# Patient Record
Sex: Female | Born: 1995 | Hispanic: Yes | Marital: Single | Smoking: Never smoker
Health system: Southern US, Community
[De-identification: ages and names within clinical notes are randomized; demographics above are authoritative.]

## PROBLEM LIST (undated history)

## (undated) DIAGNOSIS — F32A Depression, unspecified: Secondary | ICD-10-CM

## (undated) DIAGNOSIS — F329 Major depressive disorder, single episode, unspecified: Secondary | ICD-10-CM

---
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
## 2014-07-22 NOTE — H&P (Signed)
PATIENT NAME:  Sherry Rose, CHIASSON MR#:  [id number] DATE OF BIRTH:  [birth year]  DATE OF ADMISSION:  01/25/2012  IDENTIFYING INFORMATION AND CHIEF COMPLAINT: 19 year old woman who was brought to the Emergency Room after taking an intentional overdose of over-the-counter pain medicine.   CHIEF COMPLAINT: "I took an overdose".   HISTORY OF PRESENT ILLNESS: The patient reports that yesterday she was feeling down, worried, feeling bad about herself so she took an overdose of about 9 pills which she thinks were ibuprofen. Judging from the labs it looks like possibly they were actually acetaminophen. She says at the time she actually was thinking about killing herself although after doing it she started to feel bad and told a friend. She has been feeling down and depressed probably for most of a year. Mood stays down. She has a lot of negative thoughts about herself. Feels fatigued a lot of the time. Her sleep is poor. She denies any psychotic symptoms. Denies that she routinely thinks about killing herself but frequently feels hopeless. She has stress from worrying about her poor relationship with her father and also her work and her future. Also says that she worries a lot that people spread rumors about her and it hurts her feelings and makes her feel bad. She denies using drugs or alcohol. She is not currently getting any psychiatric or psychological treatment.   PAST PSYCHIATRIC HISTORY: She says that for most of the past school year she was seen by the counselor at her school regularly. The school was worried enough about her that they sent her to the [facility] to be evaluated but, according to the patient, she was told there that she had "no problem" and she was not given any further treatment. Never been in a psychiatric hospital before. Denies that she has ever tried to kill herself in the past.   SUBSTANCE ABUSE HISTORY: Denies any history of alcohol or drug abuse.   FAMILY HISTORY:  Denies any family history of mental illness.   SOCIAL HISTORY: The patient graduated from high school and is currently working doing some Location manager. She is hoping to save up enough money to someday go to college. She lives with her parents and three younger brothers. She does not get along with her father who sounds like he is overprotective of her and treats her as though she had something to be ashamed of. She feels frequently shamed by people who have spread rumors about her. She does have friends who she trusts and feels good about and she has a good relationship with her brothers.   PAST MEDICAL HISTORY: Denies any significant medical problems.   CURRENT MEDICATIONS: None.   ALLERGIES: No known drug allergies.   REVIEW OF SYSTEMS: Complains of depression, fatigue, low self esteem, negative thoughts about herself. Denies any psychotic symptoms. Denies acute suicidal ideation.   MENTAL STATUS EXAM: Neatly dressed and groomed woman who looks her stated age. Cooperative with the interview. Eye contact was pretty good. Psychomotor activity depressed. Affect flat, dysphoric, and depressed. Speech quiet and slow. Affect down. Mood stated as being still down. Thoughts are lucid but slow. No loosening of associations. No delusional thinking evident. Denies hallucinations. Denies acute suicidal or homicidal ideation. Intelligence normal. Short and long-term memory appear grossly intact. Alert and oriented x4.   PHYSICAL EXAMINATION:   GENERAL: The patient is a healthy appearing young woman who does not appear to be in any acute distress.   SKIN: No skin  lesions evident or complained of.   HEENT: Pupils are equal and reactive. Face is symmetric. Teeth are normal. Oral mucosa normal. Musculature normal tone. Strength and reflexes normal throughout.   NEUROLOGICAL: Cranial nerves symmetric and normal. Neck and back nontender.   HEART: Regular rate and rhythm. No extra sounds.    LUNGS: Clear. No wheezes.   ABDOMEN: Nontender. Normal bowel sounds.   EXTREMITIES: Gait normal. Full range of motion at all extremities.   VITAL SIGNS: Temperature 97.7, pulse 69, respirations 18, blood pressure 126/64.   LABORATORY DATA: Drug screen negative. Pregnancy screen negative. TSH normal. Chemistries unremarkable. She did have a slightly elevated acetaminophen level at 70 but it is not in the toxic range.   ASSESSMENT: This is an 19 year old woman who took an overdose probably of Tylenol. May have had real suicidal intent at the time. Has multiple other symptoms and history consistent with depression. Appropriate admission because of depression, suicidal ideation, failure of any outpatient treatment.   TREATMENT PLAN:  1. Supportive therapy and counseling.  2. Engage in groups and activities.  3. Daily monitoring of mood and behavior.  4. Review of labs and vitals.  5. I suggested to her that we start citalopram 20 mg a day for depression and she agreed.   DIAGNOSIS PRINCIPLE AND PRIMARY:  AXIS I: Major depressive episode, single, severe.   SECONDARY DIAGNOSES:  AXIS I: No further.   AXIS II: No diagnosis.   AXIS III: Status post acetaminophen overdose without likely sequela.   AXIS IV: Moderate. Chronic stress from family dysfunction.   AXIS V: Functioning at time of evaluation 30.    ____________________________ Audery AmelJohn T. Gurtha Picker, MD jtc:drc D: 01/25/2012 18:51:57 ET T: 01/26/2012 06:56:42 ET JOB#: [id number]  cc: Audery AmelJohn T. Embry Manrique, MD, <Dictator> Audery AmelJOHN T Dacota Devall MD ELECTRONICALLY SIGNED 01/26/2012 22:47

## 2014-07-22 NOTE — Discharge Summary (Signed)
PATIENT NAME:  Sherry Rose, Sherry Rose MR#:  [id number] DATE OF BIRTH:  05-12-1995  DATE OF ADMISSION:  01/25/2012 DATE OF DISCHARGE:  01/27/2012  HOSPITAL COURSE: See dictated History and Physical for details of admission. This 19 year old woman came into the hospital after taking an overdose of over-the-counter pain medicine. She had had some suicidal ideation. She gave a history of what sounds like a major depression that has been going on for months. She was cooperative with treatment and forthcoming about her symptoms. She has been treated with citalopram 20 mg a day and has tolerated it fine without any side effects. She has participated appropriately in groups and individual therapy. She has not made any suicide attempts or stated any suicidal ideation on the unit. She states that she regrets what she did and she has positive things in her life to look forward to, particularly looking forward to getting back home with her family and following her long-term goals of making money and going to school. She has been counseled and educated about depression and the importance of staying involved in regular treatment as well as avoiding substance abuse. She understands and is agreeable to the treatment plan. At this point, she will be discharged home with [facility].  She is currently on citalopram 20 mg per day as medication.   LABORATORY RESULTS: Admission labs showed an acetaminophen level of 70, suggesting that was what she overdosed on even though she said ibuprofen. Creatinine level was low at 0.54, CO2 elevated at 26. Alcohol undetectable. CBC normal. TSH normal. Drug screen negative. Urinalysis normal. Pregnancy test negative.   DISCHARGE MEDICATIONS: Citalopram 20 mg p.o. daily.   MENTAL STATUS EXAM:  Neatly dressed and groomed young woman who looks her stated age, cooperative and pleasant in the interview. Good eye contact, normal psychomotor activity. Voice  normal rate, tone, and volume. Affect slightly flat but reactive. Mood stated as better. Thoughts are lucid with no loosening of associations or delusional thinking. Denies auditory or visual hallucinations. Denies any suicidal or homicidal ideation. Shows improved insight and judgment and normal intelligence, good short and long-term memory.   DISPOSITION: Discharge home with her family. [facility] and possibly medication management.   DIAGNOSIS PRINCIPLE AND PRIMARY:  AXIS I: Major depressive episode, single, severe.   SECONDARY DIAGNOSES:  AXIS I: No further.  AXIS II: No diagnosis.   AXIS III: No diagnosis.   AXIS IV: Moderate to severe social stress from her family and peer group.   AXIS V: Functioning at time of discharge: 65.      ____________________________ Audery AmelJohn T. Mellody Masri, MD jtc:bjt D: 01/27/2012 13:49:04 ET T: 01/28/2012 15:03:51 ET JOB#: [id number]  cc: Audery AmelJohn T. Moo Gravley, MD, <Dictator> Audery AmelJOHN T Tresa Jolley MD ELECTRONICALLY SIGNED 01/29/2012 22:44

## 2018-01-15 ENCOUNTER — Other Ambulatory Visit (HOSPITAL_COMMUNITY)
Admission: RE | Admit: 2018-01-15 | Discharge: 2018-01-15 | Disposition: A | Discharge status: Home / Self Care | Payer: BLUE CROSS/BLUE SHIELD | Source: Ambulatory Visit | Attending: Obstetrics & Gynecology | Admitting: Obstetrics & Gynecology

## 2018-01-15 ENCOUNTER — Ambulatory Visit: Payer: BLUE CROSS/BLUE SHIELD | Admitting: Obstetrics & Gynecology

## 2018-01-15 ENCOUNTER — Encounter: Payer: Self-pay | Admitting: Obstetrics & Gynecology

## 2018-01-15 VITALS — BP 90/60 | Ht 62.0 in | Wt 137.0 lb

## 2018-01-15 DIAGNOSIS — Z124 Encounter for screening for malignant neoplasm of cervix: Secondary | ICD-10-CM

## 2018-01-15 DIAGNOSIS — N926 Irregular menstruation, unspecified: Secondary | ICD-10-CM | POA: Diagnosis not present

## 2018-01-15 DIAGNOSIS — N930 Postcoital and contact bleeding: Secondary | ICD-10-CM | POA: Diagnosis not present

## 2018-01-15 NOTE — Progress Notes (Addendum)
Consulted by: Dr Myrene Galas Consulted for: Abnormal GYN bleeding  HPI:      Sherry Rose is a 22 y.o. G0P0000 who LMP was No LMP recorded., presents today for a problem visit.  She complains of metrorrhagia (no pattern) and post-coital bleeding that  began 8-10 months ago and its severity is described as moderate.  She has IUD for 3 years and at forst had rare bleeding but now bleeds unpredictably but every few weeks BUT ALSO AFTER SEX, with clots and pain (moderate, midline, no radiation, no modifiers or other assoc sx's)..  She has used the following for attempts at control: maxi pad.  Previous evaluation: none. Prior Diagnosis: dysfunctional uterine bleeding. Previous Treatment: none.  She is single partner, contraception - IUD.  Hx of STDs: none. She is premenopausal.  PMHx: She  has no past medical history on file. Also,  has no past surgical history on file., family history is not on file.,  reports that she has never smoked. She has never used smokeless tobacco. She reports that she does not drink alcohol or use drugs.  She  Current Outpatient Medications:  .  levonorgestrel (MIRENA) 20 MCG/24HR IUD, 1 each by Intrauterine route once., Disp: , Rfl:  .  sertraline (ZOLOFT) 50 MG tablet, TAKE 1/2 TABLET ONCE DAILY FOR 3 DAYS THEN 1 TABLET DAILY, Disp: , Rfl: 4  Also, has No Known Allergies.  Review of Systems  Constitutional: Negative for chills, fever and malaise/fatigue.  HENT: Negative for congestion, sinus pain and sore throat.   Eyes: Negative for blurred vision and pain.  Respiratory: Negative for cough and wheezing.   Cardiovascular: Negative for chest pain and leg swelling.  Gastrointestinal: Negative for abdominal pain, constipation, diarrhea, heartburn, nausea and vomiting.  Genitourinary: Negative for dysuria, frequency, hematuria and urgency.  Musculoskeletal: Negative for back pain, joint pain, myalgias and neck pain.  Skin: Negative for itching  and rash.  Neurological: Negative for dizziness, tremors and weakness.  Endo/Heme/Allergies: Does not bruise/bleed easily.  Psychiatric/Behavioral: Negative for depression. The patient is not nervous/anxious and does not have insomnia.     Objective: BP 90/60   Ht 5\' 2"  (1.575 m)   Wt 137 lb (62.1 kg)   BMI 25.06 kg/m  Physical Exam  Constitutional: She is oriented to person, place, and time. She appears well-developed and well-nourished. No distress.  Genitourinary: Rectum normal, vagina normal and uterus normal. Pelvic exam was performed with patient supine. There is no rash or lesion on the right labia. There is no rash or lesion on the left labia. Vagina exhibits no lesion. No bleeding in the vagina. Right adnexum does not display mass and does not display tenderness. Left adnexum does not display mass and does not display tenderness. Cervix does not exhibit motion tenderness, lesion, friability or polyp.   Uterus is mobile and midaxial. Uterus is not enlarged or exhibiting a mass.  Genitourinary Comments: No IUD strings seen No cervical lesions or polyps Uterus deviated to left  HENT:  Head: Normocephalic and atraumatic. Head is without laceration.  Right Ear: Hearing normal.  Left Ear: Hearing normal.  Nose: No epistaxis.  No foreign bodies.  Mouth/Throat: Uvula is midline, oropharynx is clear and moist and mucous membranes are normal.  Eyes: Pupils are equal, round, and reactive to light.  Neck: Normal range of motion. Neck supple. No thyromegaly present.  Cardiovascular: Normal rate and regular rhythm. Exam reveals no gallop and no friction rub.  No murmur heard. Pulmonary/Chest:  Effort normal and breath sounds normal. No respiratory distress. She has no wheezes.  Abdominal: Soft. Bowel sounds are normal. She exhibits no distension. There is no tenderness. There is no rebound.  Musculoskeletal: Normal range of motion.  Neurological: She is alert and oriented to person, place,  and time. No cranial nerve deficit.  Skin: Skin is warm and dry.  Psychiatric: She has a normal mood and affect. Judgment normal.  Vitals reviewed.  ASSESSMENT/PLAN:  abnormal bleeding on hormone replacement therapy (IUD)  Problem List Items Addressed This Visit      Other   PCB (post coital bleeding) - Primary   Relevant Orders   Cytology - PAP   US PELVIS TRANSVANGINAL NON-OB (TV ONLY)   Irregular menses   Relevant Orders   US PELVIS TRANSVANGINAL NON-OB (TV ONLY)    Other Visit Diagnoses    Screening for cervical cancer       Relevant Orders   Cytology - PAP    Patient has abnormal uterine bleeding . She has a normal exam today, with no evidence of lesions.  Evaluation includes the following: exam, labs such as hormonal testing, and pelvic ultrasound to evaluate for any structural gynecologic abnormalities.  Patient to follow up after testing.  Treatment option for menorrhagia or menometrorrhagia discussed in great detail with the patient.  Options include hormonal therapy, IUD therapy such as Mirena, D&C, Ablation, and Hysterectomy.  The pros and cons of each option discussed with patient.  Annamarie Major, MD, [facility], [facility] 01/15/2018  9:42 AM

## 2018-01-15 NOTE — Patient Instructions (Signed)

## 2018-01-17 LAB — CYTOLOGY - PAP
Chlamydia: NEGATIVE
Diagnosis: NEGATIVE
Neisseria Gonorrhea: NEGATIVE

## 2018-01-23 ENCOUNTER — Ambulatory Visit (INDEPENDENT_AMBULATORY_CARE_PROVIDER_SITE_OTHER): Payer: BLUE CROSS/BLUE SHIELD | Admitting: Obstetrics & Gynecology

## 2018-01-23 ENCOUNTER — Encounter: Payer: Self-pay | Admitting: Obstetrics & Gynecology

## 2018-01-23 ENCOUNTER — Ambulatory Visit (INDEPENDENT_AMBULATORY_CARE_PROVIDER_SITE_OTHER): Payer: BLUE CROSS/BLUE SHIELD

## 2018-01-23 VITALS — BP 100/60 | Ht 62.0 in | Wt 136.0 lb

## 2018-01-23 DIAGNOSIS — Z30432 Encounter for removal of intrauterine contraceptive device: Secondary | ICD-10-CM

## 2018-01-23 DIAGNOSIS — N926 Irregular menstruation, unspecified: Secondary | ICD-10-CM | POA: Diagnosis not present

## 2018-01-23 DIAGNOSIS — N930 Postcoital and contact bleeding: Secondary | ICD-10-CM

## 2018-01-23 MED ORDER — NORETHIN ACE-ETH ESTRAD-FE 1-20 MG-MCG(24) PO CAPS: 1.0000 | ORAL_CAPSULE | Freq: Every day | ORAL | 1 refills | Status: DC

## 2018-01-23 NOTE — Patient Instructions (Signed)
Ethinyl Estradiol; Norethindrone Acetate; Ferrous fumarate tablets or capsules What is this medicine? ETHINYL ESTRADIOL; NORETHINDRONE ACETATE; FERROUS FUMARATE (ETH in il es tra DYE ole; nor eth IN drone AS e tate; FER us FUE ma rate) is an oral contraceptive. The products combine two types of female hormones, an estrogen and a progestin. They are used to prevent ovulation and pregnancy. Some products are also used to treat acne in females. This medicine may be used for other purposes; ask your health care provider or pharmacist if you have questions. COMMON BRAND NAME(S): Blisovi 24 Fe, Blisovi Fe, Estrostep Fe, Gildess 24 Fe, Gildess Fe 1.5/30, Gildess Fe 1/20, Junel Fe 1.5/30, Junel Fe 1/20, Junel Fe 24, Larin Fe, Lo Loestrin Fe, Loestrin 24 Fe, Loestrin FE 1.5/30, Loestrin FE 1/20, Lomedia 24 Fe, Microgestin 24 Fe, Microgestin Fe 1.5/30, Microgestin Fe 1/20, Tarina Fe 1/20, Taytulla, Tilia Fe, Tri-Legest Fe What should I tell my health care provider before I take this medicine? They need to know if you have any of these conditions: -abnormal vaginal bleeding -blood vessel disease -breast, cervical, endometrial, ovarian, liver, or uterine cancer -diabetes -gallbladder disease -heart disease or recent heart attack -high blood pressure -high cholesterol -history of blood clots -kidney disease -liver disease -migraine headaches -smoke tobacco -stroke -systemic lupus erythematosus (SLE) -an unusual or allergic reaction to estrogens, progestins, other medicines, foods, dyes, or preservatives -pregnant or trying to get pregnant -breast-feeding How should I use this medicine? Take this medicine by mouth. To reduce nausea, this medicine may be taken with food. Follow the directions on the prescription label. Take this medicine at the same time each day and in the order directed on the package. Do not take your medicine more often than directed. A patient package insert for the product will be  given with each prescription and refill. Read this sheet carefully each time. The sheet may change frequently. Contact your pediatrician regarding the use of this medicine in children. Special care may be needed. This medicine has been used in female children who have started having menstrual periods. Overdosage: If you think you have taken too much of this medicine contact a poison control center or emergency room at once. NOTE: This medicine is only for you. Do not share this medicine with others. What if I miss a dose? If you miss a dose, refer to the patient information sheet you received with your medicine for direction. If you miss more than one pill, this medicine may not be as effective and you may need to use another form of birth control. What may interact with this medicine? Do not take this medicine with the following medication: -dasabuvir; ombitasvir; paritaprevir; ritonavir -ombitasvir; paritaprevir; ritonavir This medicine may also interact with the following medications: -acetaminophen -antibiotics or medicines for infections, especially rifampin, rifabutin, rifapentine, and griseofulvin, and possibly penicillins or tetracyclines -aprepitant -ascorbic acid (vitamin C) -atorvastatin -barbiturate medicines, such as phenobarbital -bosentan -carbamazepine -caffeine -clofibrate -cyclosporine -dantrolene -doxercalciferol -felbamate -grapefruit juice -hydrocortisone -medicines for anxiety or sleeping problems, such as diazepam or temazepam -medicines for diabetes, including pioglitazone -mineral oil -modafinil -mycophenolate -nefazodone -oxcarbazepine -phenytoin -prednisolone -ritonavir or other medicines for HIV infection or AIDS -rosuvastatin -selegiline -soy isoflavones supplements -St. John's wort -tamoxifen or raloxifene -theophylline -thyroid hormones -topiramate -warfarin This list may not describe all possible interactions. Give your health care  provider a list of all the medicines, herbs, non-prescription drugs, or dietary supplements you use. Also tell them if you smoke, drink alcohol, or use illegal drugs. Some   items may interact with your medicine. What should I watch for while using this medicine? Visit your doctor or health care professional for regular checks on your progress. You will need a regular breast and pelvic exam and Pap smear while on this medicine. Use an additional method of contraception during the first cycle that you take these tablets. If you have any reason to think you are pregnant, stop taking this medicine right away and contact your doctor or health care professional. If you are taking this medicine for hormone related problems, it may take several cycles of use to see improvement in your condition. Smoking increases the risk of getting a blood clot or having a stroke while you are taking birth control pills, especially if you are more than 24 years old. You are strongly advised not to smoke. This medicine can make your body retain fluid, making your fingers, hands, or ankles swell. Your blood pressure can go up. Contact your doctor or health care professional if you feel you are retaining fluid. This medicine can make you more sensitive to the sun. Keep out of the sun. If you cannot avoid being in the sun, wear protective clothing and use sunscreen. Do not use sun lamps or tanning beds/booths. If you wear contact lenses and notice visual changes, or if the lenses begin to feel uncomfortable, consult your eye care specialist. In some women, tenderness, swelling, or minor bleeding of the gums may occur. Notify your dentist if this happens. Brushing and flossing your teeth regularly may help limit this. See your dentist regularly and inform your dentist of the medicines you are taking. If you are going to have elective surgery, you may need to stop taking this medicine before the surgery. Consult your health care  professional for advice. This medicine does not protect you against HIV infection (AIDS) or any other sexually transmitted diseases. What side effects may I notice from receiving this medicine? Side effects that you should report to your doctor or health care professional as soon as possible: -allergic reactions like skin rash, itching or hives, swelling of the face, lips, or tongue -breast tissue changes or discharge -changes in vaginal bleeding during your period or between your periods -changes in vision -chest pain -confusion -coughing up blood -dizziness -feeling faint or lightheaded -headaches or migraines -leg, arm or groin pain -loss of balance or coordination -severe or sudden headaches -stomach pain (severe) -sudden shortness of breath -sudden numbness or weakness of the face, arm or leg -symptoms of vaginal infection like itching, irritation or unusual discharge -tenderness in the upper abdomen -trouble speaking or understanding -vomiting -yellowing of the eyes or skin Side effects that usually do not require medical attention (report to your doctor or health care professional if they continue or are bothersome): -breakthrough bleeding and spotting that continues beyond the 3 initial cycles of pills -breast tenderness -mood changes, anxiety, depression, frustration, anger, or emotional outbursts -increased sensitivity to sun or ultraviolet light -nausea -skin rash, acne, or brown spots on the skin -weight gain (slight) This list may not describe all possible side effects. Call your doctor for medical advice about side effects. You may report side effects to FDA at 1-800-FDA-1088. Where should I keep my medicine? Keep out of the reach of children. Store at room temperature between 15 and 30 degrees C (59 and 86 degrees F). Throw away any unused medicine after the expiration date. NOTE: This sheet is a summary. It may not cover all possible information. If you   have  questions about this medicine, talk to your doctor, pharmacist, or health care provider.  2018 Elsevier/Gold Standard (2015-11-30 08:04:41)  

## 2018-01-23 NOTE — Progress Notes (Signed)
  HPI: She complains of metrorrhagia (no pattern) and post-coital bleeding that  began 8-10 months ago and its severity is described as moderate.  She has IUD (for period control, not sexually active) for 3 years and at first had rare bleeding but now bleeds unpredictably but every few weeks BUT ALSO AFTER SEX, with clots and pain (moderate, midline, no radiation, no modifiers or other assoc sx's).  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 Also,  has no past surgical history on file., family history is not on file.,  reports that she has never smoked. She has never used smokeless tobacco. She reports that she does not drink alcohol or use drugs.  She has a current medication list which includes the following prescription(s): levonorgestrel and sertraline. Also, has No Known Allergies.  Review of Systems  All other systems reviewed and are negative.  Objective: BP 100/60   Ht 5\' 2"  (1.575 m)   Wt 136 lb (61.7 kg)   BMI 24.87 kg/m   Physical examination Constitutional NAD, Conversant  Skin No rashes, lesions or ulceration.   Extremities: Moves all appropriately.  Normal ROM for age. No lymphadenopathy.  Neuro: Grossly intact  Psych: Oriented to PPT.  Normal mood. Normal affect.   US Pelvis Transvanginal Non-ob (tv Only)  Result Date: 01/23/2018 Patient Name: Sherry Rose DOB: Jul 12, 1995 MRN: [id number] ULTRASOUND REPORT Location: [facility] Date of Service: 01/23/2018 Indications:AUB Findings: The uterus is anteflexed and measures 7.9 x 4.5 x 2.6cm. Echo texture is homogenous without evidence of focal masses. The Endometrium measures 3.1 mm. Ultrasound appearance of arcuate uterus. IUD in place with questionable limbs pushing into myometrium due to arcuate uterus. Right Ovary measures 3.1 x 1.9 x 1.9 cm. It is normal in appearance. Left Ovary measures 2.2 x 1.7 x 1.7 cm.  It is normal in appearance. Survey of the adnexa demonstrates no adnexal masses. There is no free fluid in the cul de sac. Impression: 1. IUD in place with ultrasound appearance of arcuate uterus. Recommendations: 1.Clinical correlation with the patient's History and Physical Exam. Sherry Rose, RDMS RVT Review of ULTRASOUND.    I have personally reviewed images and report of recent ultrasound done at [facility].    Plan of management to be discussed with patient. Sherry Major, MD, FACOG [facility], [facility] 01/23/2018  2:23 PM   Pelvic exam:  Two IUD strings present seen coming from the cervical os. EGBUS, vaginal vault and cervix: within normal limits  IUD Removal Strings of IUD identified and grasped.  IUD removed without problem.  Pt tolerated this well.  IUD noted to be intact.  Assessment:  PCB (post coital bleeding) Irregular menses  Remove IUD today and start OCP    (Not sexually active)    Desires period control that has not been the case w IUD last several mos.    Rx [place]  A total of 15 minutes were spent face-to-face with the patient during this encounter and over half of that time dealt with counseling and coordination of care.  Sherry Major, MD, [facility], [facility] 01/23/2018  2:24 PM

## 2018-04-04 ENCOUNTER — Encounter: Payer: Self-pay | Admitting: Emergency Medicine

## 2018-04-04 ENCOUNTER — Emergency Department: Payer: BLUE CROSS/BLUE SHIELD

## 2018-04-04 ENCOUNTER — Emergency Department
Admission: EM | Admit: 2018-04-04 | Discharge: 2018-04-04 | Disposition: A | Discharge status: Home / Self Care | Payer: BLUE CROSS/BLUE SHIELD | Attending: Emergency Medicine | Admitting: Emergency Medicine

## 2018-04-04 ENCOUNTER — Other Ambulatory Visit: Payer: Self-pay

## 2018-04-04 DIAGNOSIS — H1089 Other conjunctivitis: Secondary | ICD-10-CM | POA: Diagnosis not present

## 2018-04-04 DIAGNOSIS — Z79899 Other long term (current) drug therapy: Secondary | ICD-10-CM | POA: Diagnosis not present

## 2018-04-04 DIAGNOSIS — F329 Major depressive disorder, single episode, unspecified: Secondary | ICD-10-CM | POA: Diagnosis not present

## 2018-04-04 DIAGNOSIS — R05 Cough: Secondary | ICD-10-CM | POA: Diagnosis present

## 2018-04-04 DIAGNOSIS — J40 Bronchitis, not specified as acute or chronic: Secondary | ICD-10-CM | POA: Insufficient documentation

## 2018-04-04 DIAGNOSIS — H1032 Unspecified acute conjunctivitis, left eye: Secondary | ICD-10-CM

## 2018-04-04 HISTORY — DX: Depression, unspecified: F32.A

## 2018-04-04 HISTORY — DX: Major depressive disorder, single episode, unspecified: F32.9

## 2018-04-04 LAB — GROUP A STREP BY PCR: GROUP A STREP BY PCR: NOT DETECTED

## 2018-04-04 MED ORDER — PREDNISONE 50 MG PO TABS: ORAL_TABLET | ORAL | 0 refills | Status: DC

## 2018-04-04 MED ORDER — AZITHROMYCIN 250 MG PO TABS: ORAL_TABLET | ORAL | 0 refills | Status: AC

## 2018-04-04 MED ORDER — POLYMYXIN B-TRIMETHOPRIM 10000-0.1 UNIT/ML-% OP SOLN: 1.0000 [drp] | OPHTHALMIC | 0 refills | Status: AC

## 2018-04-04 NOTE — ED Triage Notes (Addendum)
Pt in via POV, reports cough, congestion x approximately two weeks without any relief.  Vitals WDL, NAD noted at this time.

## 2018-04-04 NOTE — ED Notes (Signed)
Pt states that she has had a cough and swelling in throat for the last week that "has not gotten better." Pt states that in the past she would take tamiflu and then it would get better but this time it has "gotten worse in the last couple of days." pt in NAD at this time.

## 2018-04-04 NOTE — ED Provider Notes (Signed)
[facility] Emergency Department Provider Note  ____________________________________________  Time seen: Approximately 9:27 PM  I have reviewed the triage vital signs and the nursing notes.   HISTORY  Chief Complaint URI    HPI Sherry Rose is a 23 y.o. female presents to the emergency department with productive cough for approximately 2 weeks. Patient also describes breathlessness and fatigue. Associated symptoms include rhinorrhea and nasal congestion but no pharyngitis. Patient is secondarily complaining of right eye conjunctivitis for the past 2 to 3 days. No alleviating measures have been attempted.     Past Medical History:  Diagnosis Date  . Depression     Patient Active Problem List   Diagnosis Date Noted  . 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 Irregular menses 01/15/2018    History reviewed. No pertinent surgical history.  Prior to Admission medications   Medication Sig Start Date End Date Taking? Authorizing Provider  azithromycin (ZITHROMAX Z-PAK) 250 MG tablet Take 2 tablets (500 mg) on  Day 1,  followed by 1 tablet (250 mg) once daily on Days 2 through 5. 04/04/18 04/09/18  Orvil Feil, PA-C  levonorgestrel (MIRENA) 20 MCG/24HR IUD 1 each by Intrauterine route once.    [provider]  Norethin Ace-Eth Estrad-FE (TAYTULLA) 1-20 MG-MCG(24) CAPS Take 1 tablet by mouth daily. 01/23/18   Nadara Mustard, MD  predniSONE (DELTASONE) 50 MG tablet Take one 50 mg tablet once daily for the next five days. 04/04/18   Orvil Feil, PA-C  sertraline (ZOLOFT) 50 MG tablet TAKE 1/2 TABLET ONCE DAILY FOR 3 DAYS THEN 1 TABLET DAILY 12/25/17   [provider]  trimethoprim-polymyxin b (POLYTRIM) ophthalmic solution Place 1 drop into the left eye every 4 (four) hours for 7 days. 04/04/18 04/11/18  Orvil Feil, PA-C    Allergies Patient has no known allergies.  No family history on file.  Social History Social History    Tobacco Use  . Smoking status: Never Smoker  . Smokeless tobacco: Never Used  Substance Use Topics  . Alcohol use: Never    Frequency: Never  . Drug use: Never     Review of Systems  Constitutional: No fever/chills Eyes: Patient has right eye conjunctivitis. no discharge ENT: No upper respiratory complaints. Cardiovascular: no chest pain. Respiratory: Patient has productive cough. No SOB. Gastrointestinal: No abdominal pain.  No nausea, no vomiting.  No diarrhea.  No constipation. Genitourinary: Negative for dysuria. No hematuria Musculoskeletal: Negative for musculoskeletal pain. Skin: Negative for rash, abrasions, lacerations, ecchymosis. Neurological: Negative for headaches, focal weakness or numbness.   ____________________________________________   PHYSICAL EXAM:  VITAL SIGNS: ED Triage Vitals  Enc Vitals Group     BP 04/04/18 1951 (!) 129/53     Pulse Rate 04/04/18 1951 86     Resp 04/04/18 1951 16     Temp 04/04/18 1951 98.1 F (36.7 C)     Temp Source 04/04/18 1951 Oral     SpO2 04/04/18 1951 98 %     Weight 04/04/18 1952 140 lb (63.5 kg)     Height 04/04/18 1952 5\' 3"  (1.6 m)     Head Circumference --      Peak Flow --      Pain Score 04/04/18 1952 7     Pain Loc --      Pain Edu? --      Excl. in GC? --      Constitutional: Alert and oriented. Well appearing and in no  acute distress. Eyes: Conjunctivae are normal. PERRL. EOMI. Head: Atraumatic. ENT:      Ears: TMs are pearly.      Nose: No congestion/rhinnorhea.      Mouth/Throat: Mucous membranes are moist.  Neck: No stridor.  No cervical spine tenderness to palpation. Cardiovascular: Normal rate, regular rhythm. Normal S1 and S2.  Good peripheral circulation. Respiratory: Normal respiratory effort without tachypnea or retractions. Lungs CTAB. Good air entry to the bases with no decreased or absent breath sounds. Gastrointestinal: Bowel sounds 4 quadrants. Soft and nontender to palpation. No  guarding or rigidity. No palpable masses. No distention. No CVA tenderness. Musculoskeletal: Full range of motion to all extremities. No gross deformities appreciated. Neurologic:  Normal speech and language. No gross focal neurologic deficits are appreciated.  Skin:  Skin is warm, dry and intact. No rash noted. Psychiatric: Mood and affect are normal. Speech and behavior are normal. Patient exhibits appropriate insight and judgement.   ____________________________________________   LABS (all labs ordered are listed, but only abnormal results are displayed)  Labs Reviewed  GROUP A STREP BY PCR   ____________________________________________  EKG   ____________________________________________  RADIOLOGY I personally viewed and evaluated these images as part of my medical decision making, as well as reviewing the written report by the radiologist.  Dg Chest 2 View  Result Date: 04/04/2018 CLINICAL DATA:  Cough and congestion for 2 weeks. EXAM: CHEST - 2 VIEW COMPARISON:  None. FINDINGS: The heart size and mediastinal contours are within normal limits. Both lungs are clear. The visualized skeletal structures are unremarkable. IMPRESSION: No active cardiopulmonary disease. Electronically Signed   By: Sherian Rein M.D.   On: 04/04/2018 20:16    ____________________________________________    PROCEDURES  Procedure(s) performed:    Procedures    Medications - No data to display   ____________________________________________   INITIAL IMPRESSION / ASSESSMENT AND PLAN / ED COURSE  Pertinent labs & imaging results that were available during my care of the patient were reviewed by me and considered in my medical decision making (see chart for details).  Review of the [place] CSRS was performed in accordance of the [facility] prior to dispensing any controlled drugs.    Assessment and plan Bronchitis Conjunctivitis Patient presents to the emergency department with productive cough,  shortness of breath and fatigue for the past 2 weeks.  Chest x-ray reveals no consolidations, opacities or infiltrates suggestive of community-acquired pneumonia.  Patient was treated empirically with azithromycin and prednisone.  Patient was also discharged with Polytrim for right eye conjunctivitis.     ____________________________________________  FINAL CLINICAL IMPRESSION(S) / ED DIAGNOSES  Final diagnoses:  Bronchitis  Acute bacterial conjunctivitis of left eye      NEW MEDICATIONS STARTED DURING THIS VISIT:  ED Discharge Orders         Ordered    azithromycin (ZITHROMAX Z-PAK) 250 MG tablet     04/04/18 2119    predniSONE (DELTASONE) 50 MG tablet     04/04/18 2119    trimethoprim-polymyxin b (POLYTRIM) ophthalmic solution  Every 4 hours     04/04/18 2120              This chart was dictated using voice recognition software/Dragon. Despite best efforts to proofread, errors can occur which can change the meaning. Any change was purely unintentional.    Orvil Feil, PA-C 04/04/18 2142    Dionne Bucy, MD 04/05/18 [phone]

## 2018-04-10 ENCOUNTER — Ambulatory Visit: Payer: BLUE CROSS/BLUE SHIELD | Admitting: Obstetrics & Gynecology

## 2018-04-11 ENCOUNTER — Ambulatory Visit: Payer: BLUE CROSS/BLUE SHIELD | Admitting: Obstetrics & Gynecology

## 2018-12-27 ENCOUNTER — Ambulatory Visit: Payer: BC Managed Care – PPO | Admitting: Obstetrics & Gynecology

## 2018-12-27 ENCOUNTER — Encounter: Payer: Self-pay | Admitting: Obstetrics & Gynecology

## 2018-12-27 ENCOUNTER — Other Ambulatory Visit: Payer: Self-pay

## 2018-12-27 VITALS — BP 120/80 | Ht 62.0 in | Wt 150.0 lb

## 2018-12-27 DIAGNOSIS — Z3201 Encounter for pregnancy test, result positive: Secondary | ICD-10-CM | POA: Diagnosis not present

## 2018-12-27 DIAGNOSIS — N926 Irregular menstruation, unspecified: Secondary | ICD-10-CM

## 2018-12-27 DIAGNOSIS — Z30016 Encounter for initial prescription of transdermal patch hormonal contraceptive device: Secondary | ICD-10-CM | POA: Diagnosis not present

## 2018-12-27 LAB — POCT URINE PREGNANCY: Preg Test, Ur: POSITIVE — AB

## 2018-12-27 MED ORDER — XULANE 150-35 MCG/24HR TD PTWK: 1.0000 | MEDICATED_PATCH | TRANSDERMAL | 2 refills | Status: DC

## 2018-12-27 NOTE — Patient Instructions (Signed)
Ethinyl Estradiol; Norelgestromin skin patches What is this medicine? ETHINYL ESTRADIOL;NORELGESTROMIN (ETH in il es tra DYE ole; nor el JES troe min) skin patch is used as a contraceptive (birth control method). This medicine combines two types of female hormones, an estrogen and a progestin. This patch is used to prevent ovulation and pregnancy. This medicine may be used for other purposes; ask your health care provider or pharmacist if you have questions. COMMON BRAND NAME(S): Ortho Evra, Xulane What should I tell my health care provider before I take this medicine? They need to know if you have or ever had any of these conditions:  abnormal vaginal bleeding  blood vessel disease or blood clots  breast, cervical, endometrial, ovarian, liver, or uterine cancer  diabetes  gallbladder disease  having surgery  heart disease or recent heart attack  high blood pressure  high cholesterol or triglycerides  history of irregular heartbeat or heart valve problems  kidney disease  liver disease  migraine headaches  protein C deficiency  protein S deficiency  recently had a baby, miscarriage, or abortion  stroke  systemic lupus erythematosus (SLE)  tobacco smoker  an unusual or allergic reaction to estrogens, progestins, other medicines, foods, dyes, or preservatives  pregnant or trying to get pregnant  breast-feeding How should I use this medicine? This patch is applied to the skin. Follow the directions on the prescription label. Apply to clean, dry, healthy skin on the buttock, abdomen, upper outer arm or upper torso, in a place where it will not be rubbed by tight clothing. Do not use lotions or other cosmetics on the site where the patch will go. Press the patch firmly in place for 10 seconds to ensure good contact with the skin. Change the patch every 7 days on the same day of the week for 3 weeks. You will then have a break from the patch for 1 week, after which you  will apply a new patch. Do not use your medicine more often than directed. Contact your pediatrician regarding the use of this medicine in children. Special care may be needed. This medicine has been used in female children who have started having menstrual periods. A patient package insert for the product will be given with each prescription and refill. Read this sheet carefully each time. The sheet may change frequently. Overdosage: If you think you have taken too much of this medicine contact a poison control center or emergency room at once. NOTE: This medicine is only for you. Do not share this medicine with others. What if I miss a dose? You will need to replace your patch once a week as directed. If your patch is lost or falls off, contact your health care professional for advice. You may need to use another form of birth control if your patch has been off for more than 1 day. What may interact with this medicine? Do not take this medicine with the following medications:  dasabuvir; ombitasvir; paritaprevir; ritonavir  ombitasvir; paritaprevir; ritonavir This medicine may also interact with the following medications:  acetaminophen  antibiotics or medicines for infections, especially rifampin, rifabutin, rifapentine, and possibly penicillins or tetracyclines  aprepitant or fosaprepitant  armodafinil  ascorbic acid (vitamin C)  barbiturate medicines, such as phenobarbital or primidone  bosentan  certain antiviral medicines for hepatitis, HIV or AIDS  certain medicines for cancer treatment  certain medicines for seizures like carbamazepine, clobazam, felbamate, lamotrigine, oxcarbazepine, phenytoin, rufinamide, topiramate  certain medicines for treating high cholesterol  cyclosporine    dantrolene  elagolix  flibanserin  grapefruit juice  lesinurad  medicines for diabetes  medicines to treat fungal infections, such as griseofulvin, miconazole, fluconazole,  ketoconazole, itraconazole, posaconazole or voriconazole  mifepristone  mitotane  modafinil  morphine  mycophenolate  St. John's wort  tamoxifen  temazepam  theophylline or aminophylline  thyroid hormones  tizanidine  tranexamic acid  ulipristal  warfarin This list may not describe all possible interactions. Give your health care provider a list of all the medicines, herbs, non-prescription drugs, or dietary supplements you use. Also tell them if you smoke, drink alcohol, or use illegal drugs. Some items may interact with your medicine. What should I watch for while using this medicine? Visit your doctor or health care professional for regular checks on your progress. You will need a regular breast and pelvic exam and Pap smear while on this medicine. Use an additional method of contraception during the first cycle that you use this patch. If you have any reason to think you are pregnant, stop using this medicine right away and contact your doctor or health care professional. If you are using this medicine for hormone related problems, it may take several cycles of use to see improvement in your condition. Smoking increases the risk of getting a blood clot or having a stroke while you are using hormonal birth control, especially if you are more than 25 years old. You are strongly advised not to smoke. This medicine can make your body retain fluid, making your fingers, hands, or ankles swell. Your blood pressure can go up. Contact your doctor or health care professional if you feel you are retaining fluid. This medicine can make you more sensitive to the sun. Keep out of the sun. If you cannot avoid being in the sun, wear protective clothing and use sunscreen. Do not use sun lamps or tanning beds/booths. If you wear contact lenses and notice visual changes, or if the lenses begin to feel uncomfortable, consult your eye care specialist. In some women, tenderness, swelling, or  minor bleeding of the gums may occur. Notify your dentist if this happens. Brushing and flossing your teeth regularly may help limit this. See your dentist regularly and inform your dentist of the medicines you are taking. If you are going to have elective surgery or a MRI, you may need to stop using this medicine before the surgery or MRI. Consult your health care professional for advice. This medicine does not protect you against HIV infection (AIDS) or any other sexually transmitted diseases. What side effects may I notice from receiving this medicine? Side effects that you should report to your doctor or health care professional as soon as possible:  allergic reactions such as skin rash or itching, hives, swelling of the lips, mouth, tongue, or throat  breast tissue changes or discharge  dark patches of skin on your forehead, cheeks, upper lip, and chin  depression  high blood pressure  migraines or severe, sudden headaches  missed menstrual periods  signs and symptoms of a blood clot such as breathing problems; changes in vision; chest pain; severe, sudden headache; pain, swelling, warmth in the leg; trouble speaking; sudden numbness or weakness of the face, arm or leg  skin reactions at the patch site such as blistering, bleeding, itching, rash, or swelling  stomach pain  yellowing of the eyes or skin Side effects that usually do not require medical attention (report these to your doctor or health care professional if they continue or are bothersome):    breast tenderness  irregular vaginal bleeding or spotting, particularly during the first 3 months of use  headache  nausea  painful menstrual periods  skin redness or mild irritation at site where applied  weight gain (slight) This list may not describe all possible side effects. Call your doctor for medical advice about side effects. You may report side effects to FDA at 1-800-FDA-1088. Where should I keep my  medicine? Keep out of the reach of children. Store at room temperature between 15 and 30 degrees C (59 and 86 degrees F). Keep the patch in its pouch until time of use. Throw away any unused medicine after the expiration date. Dispose of used patches properly. Since a used patch may still contain active hormones, fold the patch in half so that it sticks to itself prior to disposal. Throw away in a place where children or pets cannot reach. NOTE: This sheet is a summary. It may not cover all possible information. If you have questions about this medicine, talk to your doctor, pharmacist, or health care provider.  2020 Elsevier/Gold Standard (2018-06-26 11:56:29)  

## 2018-12-27 NOTE — Progress Notes (Signed)
  Contraception Counseling Patient presents for contraception counseling. The patient is a 23 yo G0 HF who has no complaints today. The patient is currently sexually active, using condoms at this time.  Prior IUD but had difficulty and found by [place] to have arcuate uterus.  She had BTB and frequently missed pills w OCPs.  Prior irreg bleeding on Depo.  Has been off of hormonal contraception for several months now. Pertinent past medical history: none.   PMHx: She  has a past medical history of Depression. Also,  has no past surgical history on file., family history is not on file.,  reports that she has never smoked. She has never used smokeless tobacco. She reports that she does not drink alcohol or use drugs.  She has a current medication list which includes the following prescription(s): xulane, norethin ace-eth estrad-fe, and sertraline. Also, has No Known Allergies.  Review of Systems  All other systems reviewed and are negative.   Objective: BP 120/80   Ht 5\' 2"  (1.575 m)   Wt 150 lb (68 kg)   LMP 11/26/2018   BMI 27.44 kg/m  Physical Exam Constitutional:      General: She is not in acute distress.    Appearance: She is well-developed.  Musculoskeletal: Normal range of motion.  Neurological:     Mental Status: She is alert and oriented to person, place, and time.  Skin:    General: Skin is warm and dry.  Vitals signs reviewed.    Results for orders placed or performed in visit on 12/27/18  POCT urine pregnancy  Result Value Ref Range   Preg Test, Ur Positive (A) Negative    ASSESSMENT/PLAN:    Problem List Items Addressed This Visit    Late period     Relevant Orders   POCT urine pregnancy (Completed)   Encounter for initial prescription of transdermal patch hormonal contraceptive device        Desires patch over ring, or Nexplanon.  Hormonal Contraception: The risks /benefits of OCPs/Xulane/Nuvaring have been explained to the patient in detail.  Product literature  has been given to her.  I have instructed her in the use of Xulane and have given her literature reinforcing this information.  I have explained to the patient that patches are not as effective for birth control during the first month of use, and that another form of contraception should be used during this time.  Both first-day start and Sunday start have been explained.  The risks and benefits of each was discussed.  She has been made aware of  the fact that other medications may affect the efficacy of OCPs/patch/ring.  I have answered all of her questions, and I believe that she has an understanding of the effectiveness and use of Xulane.  Plan f/u 6-8 weeks, also due for PAP then  Barnett Applebaum, MD, [facility], [facility] 12/27/2018  2:25 PM

## 2019-03-05 ENCOUNTER — Other Ambulatory Visit: Payer: Self-pay

## 2019-03-05 ENCOUNTER — Ambulatory Visit (INDEPENDENT_AMBULATORY_CARE_PROVIDER_SITE_OTHER): Payer: BC Managed Care – PPO | Admitting: Obstetrics & Gynecology

## 2019-03-05 ENCOUNTER — Other Ambulatory Visit (HOSPITAL_COMMUNITY)
Admission: RE | Admit: 2019-03-05 | Discharge: 2019-03-05 | Disposition: A | Discharge status: Home / Self Care | Payer: BC Managed Care – PPO | Source: Ambulatory Visit | Attending: Obstetrics & Gynecology | Admitting: Obstetrics & Gynecology

## 2019-03-05 ENCOUNTER — Encounter: Payer: Self-pay | Admitting: Obstetrics & Gynecology

## 2019-03-05 VITALS — BP 120/80 | Ht 62.0 in | Wt 150.0 lb

## 2019-03-05 DIAGNOSIS — Z3044 Encounter for surveillance of vaginal ring hormonal contraceptive device: Secondary | ICD-10-CM

## 2019-03-05 DIAGNOSIS — Z124 Encounter for screening for malignant neoplasm of cervix: Secondary | ICD-10-CM | POA: Insufficient documentation

## 2019-03-05 DIAGNOSIS — Z01419 Encounter for gynecological examination (general) (routine) without abnormal findings: Secondary | ICD-10-CM

## 2019-03-05 MED ORDER — ETONOGESTREL-ETHINYL ESTRADIOL 0.12-0.015 MG/24HR VA RING: VAGINAL_RING | VAGINAL | 12 refills | Status: AC

## 2019-03-05 NOTE — Progress Notes (Signed)
HPI:      [name] is a 23 y.o. G0P0000 who LMP was Patient's last menstrual period was 02/09/2019., she presents today for her annual examination. The patient has no complaints today. The patient is sexually active. Her last pap: was normal. The patient does perform self breast exams.  There is no notable family history of breast or ovarian cancer in her family.  The patient has regular exercise: yes.  The patient denies current symptoms of depression.    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 Depression    History reviewed. No pertinent surgical history. History reviewed. No pertinent family history. Social History   Tobacco Use  . Smoking status: Never Smoker  . Smokeless tobacco: Never Used  Substance Use Topics  . Alcohol use: Never    Frequency: Never  . Drug use: Never    Current Outpatient Medications:  .  sertraline (ZOLOFT) 50 MG tablet, TAKE 1/2 TABLET ONCE DAILY FOR 3 DAYS THEN 1 TABLET DAILY, Disp: , Rfl: 4 .  etonogestrel-ethinyl estradiol (NUVARING) 0.12-0.015 MG/24HR vaginal ring, Insert vaginally and leave in place for 3 consecutive weeks, then remove for 1 week., Disp: 1 each, Rfl: 12 Allergies: Patient has no known allergies.  Review of Systems  Constitutional: Negative for chills, fever and malaise/fatigue.  HENT: Negative for congestion, sinus pain and sore throat.   Eyes: Negative for blurred vision and pain.  Respiratory: Negative for cough and wheezing.   Cardiovascular: Negative for chest pain and leg swelling.  Gastrointestinal: Negative for abdominal pain, constipation, diarrhea, heartburn, nausea and vomiting.  Genitourinary: Negative for dysuria, frequency, hematuria and urgency.  Musculoskeletal: Negative for back pain, joint pain, myalgias and neck pain.  Skin: Negative for itching and  rash.  Neurological: Negative for dizziness, tremors and weakness.  Endo/Heme/Allergies: Does not bruise/bleed easily.  Psychiatric/Behavioral: Negative for depression. The patient is not nervous/anxious and does not have insomnia.     Objective: BP 120/80   Ht 5\' 2"  (1.575 m)   Wt 150 lb (68 kg)   LMP 02/09/2019   BMI 27.44 kg/m   Filed Weights   03/05/19 0812  Weight: 150 lb (68 kg)   Body mass index is 27.44 kg/m. Physical Exam Constitutional:      General: She is not in acute distress.    Appearance: She is well-developed.  Genitourinary:     Pelvic exam was performed with patient supine.     Vagina, uterus and rectum normal.     No lesions in the vagina.     No vaginal bleeding.     No cervical motion tenderness, friability, lesion or polyp.     Uterus is mobile.     Uterus is not enlarged.     No uterine mass detected.    Uterus is midaxial.     No right or left adnexal mass present.     Right adnexa not tender.     Left adnexa not tender.  HENT:     Head: Normocephalic and atraumatic. No laceration.     Right Ear: Hearing normal.     Left Ear: Hearing normal.     Mouth/Throat:     Pharynx: Uvula midline.  Eyes:     Pupils: Pupils are equal, round, and reactive to light.  Neck:     Musculoskeletal: Normal range of  motion and neck supple.     Thyroid: No thyromegaly.  Cardiovascular:     Rate and Rhythm: Normal rate and regular rhythm.     Heart sounds: No murmur. No friction rub. No gallop.   Pulmonary:     Effort: Pulmonary effort is normal. No respiratory distress.     Breath sounds: Normal breath sounds. No wheezing.  Abdominal:     General: Bowel sounds are normal. There is no distension.     Palpations: Abdomen is soft.     Tenderness: There is no abdominal tenderness. There is no rebound.  Musculoskeletal: Normal range of motion.  Neurological:     Mental Status: She is alert and oriented to person, place, and time.     Cranial Nerves: No cranial  nerve deficit.  Skin:    General: Skin is warm and dry.  Psychiatric:        Judgment: Judgment normal.  Vitals signs reviewed.     Assessment:  ANNUAL EXAM 1. Women's annual routine gynecological examination   2. Screening for cervical cancer   3. Encounter for surveillance of vaginal ring hormonal contraceptive device      Screening Plan:            1.  Cervical Screening-  Pap smear done today  2. Breast screening- Exam annually and mammogram>40 planned   3. Colonoscopy every 10 years, Hemoccult testing - after age 97  4. Labs managed by PCP  5. Counseling for contraception: NuvaRing vaginal inserts - etonogestrel-ethinyl estradiol (NUVARING) 0.12-0.015 MG/24HR vaginal ring; Insert vaginally and leave in place for 3 consecutive weeks, then remove for 1 week.  Dispense: 1 each; Refill: 12     F/U  Return in about 1 year (around 03/04/2020) for Annual.  Barnett Applebaum, MD, [facility], [facility] 03/05/2019  8:32 AM

## 2019-03-05 NOTE — Patient Instructions (Signed)
Ethinyl Estradiol; Etonogestrel vaginal ring What is this medicine? ETHINYL ESTRADIOL; ETONOGESTREL (ETH in il es tra DYE ole; et oh noe JES trel) vaginal ring is a flexible, vaginal ring used as a contraceptive (birth control method). This medicine combines 2 types of female hormones, an estrogen and a progestin. This ring is used to prevent ovulation and pregnancy. Each ring is effective for 1 month. This medicine may be used for other purposes; ask your health care provider or pharmacist if you have questions. COMMON BRAND NAME(S): EluRyng, NuvaRing What should I tell my health care provider before I take this medicine? They need to know if you have any of these conditions:  abnormal vaginal bleeding  blood vessel disease or blood clots  breast, cervical, endometrial, ovarian, liver, or uterine cancer  diabetes  gallbladder disease  having surgery  heart disease or recent heart attack  high blood pressure  high cholesterol or triglycerides  history of irregular heartbeat or heart valve problems  kidney disease  liver disease  migraine headaches  protein C deficiency  protein S deficiency  recently had a baby, miscarriage, or abortion  stroke  systemic lupus erythematosus (SLE)  tobacco smoker  your age is more than 25 years old  an unusual or allergic reaction to estrogens, progestins, other medicines, foods, dyes, or preservatives  pregnant or trying to get pregnant  breast-feeding How should I use this medicine? Insert the ring into your vagina as directed. Follow the directions on the prescription label. The ring will remain place for 3 weeks and is then removed for a 1-week break. A new ring is inserted 1 week after the last ring was removed, on the same day of the week. Check often to make sure the ring is still in place. If the ring was out of the vagina for an unknown amount of time, you may not be protected from pregnancy. Perform a pregnancy test and  call your doctor. Do not use more often than directed. A patient package insert for the product will be given with each prescription and refill. Read this sheet carefully each time. The sheet may change frequently. Contact your pediatrician regarding the use of this medicine in children. Special care may be needed. Overdosage: If you think you have taken too much of this medicine contact a poison control center or emergency room at once. NOTE: This medicine is only for you. Do not share this medicine with others. What if I miss a dose? You will need to use the ring exactly as directed. It is very important to follow the schedule every cycle. If you do not use the ring as directed, you may not be protected from pregnancy. If the ring should slip out, is lost, or if you leave it in longer or shorter than you should, contact your health care professional for advice. What may interact with this medicine? Do not take this medicine with the following medications:  dasabuvir; ombitasvir; paritaprevir; ritonavir  ombitasvir; paritaprevir; ritonavir  vaginal lubricants or other vaginal products that are oil-based or silicone-based This medicine may also interact with the following medications:  acetaminophen  antibiotics or medicines for infections, especially rifampin, rifabutin, rifapentine, and griseofulvin, and possibly penicillins or tetracyclines  aprepitant or fosaprepitant  armodafinil  ascorbic acid (vitamin C)  barbiturate medicines, such as phenobarbital or primidone  bosentan  certain antiviral medicines for hepatitis, HIV or AIDS  certain medicines for cancer treatment  certain medicines for seizures like carbamazepine, clobazam, felbamate, lamotrigine, oxcarbazepine, phenytoin,   rufinamide, topiramate  certain medicines for treating high cholesterol  cyclosporine  dantrolene  elagolix  flibanserin  grapefruit juice  lesinurad  medicines for diabetes  medicines  to treat fungal infections, such as griseofulvin, miconazole, fluconazole, ketoconazole, itraconazole, posaconazole or voriconazole  mifepristone  mitotane  modafinil  morphine  mycophenolate  St. John's wort  tamoxifen  temazepam  theophylline or aminophylline  thyroid hormones  tizanidine  tranexamic acid  ulipristal  warfarin This list may not describe all possible interactions. Give your health care provider a list of all the medicines, herbs, non-prescription drugs, or dietary supplements you use. Also tell them if you smoke, drink alcohol, or use illegal drugs. Some items may interact with your medicine. What should I watch for while using this medicine? Visit your doctor or health care professional for regular checks on your progress. You will need a regular breast and pelvic exam and Pap smear while on this medicine. Check with your doctor or health care professional to see if you need an additional method of contraception during the first cycle that you use this ring. Female condoms (made with natural rubber latex, polyisoprene, and polyurethane) and spermicides may be used. Do not use a diaphragm, cervical cap, or a female condom, as the ring can interfere with these birth control methods and their proper placement. If you have any reason to think you are pregnant, stop using this medicine right away and contact your doctor or health care professional. If you are using this medicine for hormone related problems, it may take several cycles of use to see improvement in your condition. Smoking increases the risk of getting a blood clot or having a stroke while you are using hormonal birth control, especially if you are more than 25 years old. You are strongly advised not to smoke. Some women are prone to getting dark patches on the skin of the face (cholasma). Your risk of getting chloasma with this medicine is higher if you had chloasma during a pregnancy. Keep out of the  sun. If you cannot avoid being in the sun, wear protective clothing and use sunscreen. Do not use sun lamps or tanning beds/booths. This medicine can make your body retain fluid, making your fingers, hands, or ankles swell. Your blood pressure can go up. Contact your doctor or health care professional if you feel you are retaining fluid. If you are going to have elective surgery, you may need to stop using this medicine before the surgery. Consult your health care professional for advice. This medicine does not protect you against HIV infection (AIDS) or any other sexually transmitted diseases. What side effects may I notice from receiving this medicine? Side effects that you should report to your doctor or health care professional as soon as possible:  allergic reactions such as skin rash or itching, hives, swelling of the lips, mouth, tongue, or throat  depression  high blood pressure  migraines or severe, sudden headaches  signs and symptoms of a blood clot such as breathing problems; changes in vision; chest pain; severe, sudden headache; pain, swelling, warmth in the leg; trouble speaking; sudden numbness or weakness of the face, arm or leg  signs and symptoms of infection like fever or chills with dizziness and a sunburn-like rash, or pain or trouble passing urine  stomach pain  symptoms of vaginal infection like itching, irritation or unusual discharge  yellowing of the eyes or skin Side effects that usually do not require medical attention (report these to your doctor   or health care professional if they continue or are bothersome):  acne  breast pain, tenderness  irregular vaginal bleeding or spotting, particularly during the first month of use  mild headache  nausea  painful periods  vomiting This list may not describe all possible side effects. Call your doctor for medical advice about side effects. You may report side effects to FDA at 1-800-FDA-1088. Where should I  keep my medicine? Keep out of the reach of children. Store unopened rings in the original foil pouch at room temperature between 20 and 25 degrees C (68 and 77 degrees F) for up to 4 months. Protect from light. Do not store above 30 degrees C (86 degrees F). Throw away any unused medicine after the expiration date. A ring may only be used for 1 cycle (1 month). After the 3-week cycle, a used ring is removed and should be placed in the re-closable foil pouch and discarded in the trash out of reach of children and pets. Do NOT flush down the toilet. NOTE: This sheet is a summary. It may not cover all possible information. If you have questions about this medicine, talk to your doctor, pharmacist, or health care provider.  2020 Elsevier/Gold Standard (2016-11-18 14:41:10)  

## 2019-03-06 LAB — CYTOLOGY - PAP
Chlamydia: NEGATIVE
Comment: NEGATIVE
Comment: NORMAL
Diagnosis: NEGATIVE
Neisseria Gonorrhea: NEGATIVE

## 2020-07-15 ENCOUNTER — Encounter: Payer: BC Managed Care – PPO | Admitting: Obstetrics

## 2020-08-11 ENCOUNTER — Other Ambulatory Visit: Payer: Self-pay

## 2020-08-11 ENCOUNTER — Emergency Department: Admission: EM | Admit: 2020-08-11 | Payer: Self-pay | Source: Home / Self Care

## 2020-08-11 ENCOUNTER — Emergency Department
Admission: EM | Admit: 2020-08-11 | Discharge: 2020-08-11 | Disposition: A | Discharge status: Home / Self Care | Payer: BC Managed Care – PPO | Attending: Emergency Medicine | Admitting: Emergency Medicine

## 2020-08-11 DIAGNOSIS — Z3A10 10 weeks gestation of pregnancy: Secondary | ICD-10-CM | POA: Insufficient documentation

## 2020-08-11 DIAGNOSIS — O26891 Other specified pregnancy related conditions, first trimester: Secondary | ICD-10-CM | POA: Insufficient documentation

## 2020-08-11 DIAGNOSIS — O039 Complete or unspecified spontaneous abortion without complication: Secondary | ICD-10-CM | POA: Insufficient documentation

## 2020-08-11 DIAGNOSIS — O26851 Spotting complicating pregnancy, first trimester: Secondary | ICD-10-CM | POA: Insufficient documentation

## 2020-08-11 LAB — COMPREHENSIVE METABOLIC PANEL
ALT: 11 U/L (ref 0–44)
ALT: 13 U/L (ref 0–44)
AST: 25 U/L (ref 15–41)
AST: 22 U/L (ref 15–41)
Albumin: 4.3 g/dL (ref 3.5–5.0)
Albumin: 4.3 g/dL (ref 3.5–5.0)
Alkaline Phosphatase: 44 U/L (ref 38–126)
Alkaline Phosphatase: 42 U/L (ref 38–126)
Anion gap: 12 (ref 5–15)
Anion gap: 13 (ref 5–15)
BUN: 10 mg/dL (ref 6–20)
BUN: 10 mg/dL (ref 6–20)
CO2: 18 mmol/L — ABNORMAL LOW (ref 22–32)
CO2: 17 mmol/L — ABNORMAL LOW (ref 22–32)
Calcium: 9.5 mg/dL (ref 8.9–10.3)
Calcium: 9.4 mg/dL (ref 8.9–10.3)
Chloride: 104 mmol/L (ref 98–111)
Chloride: 104 mmol/L (ref 98–111)
Creatinine, Ser: 0.7 mg/dL (ref 0.44–1.00)
Creatinine, Ser: 0.64 mg/dL (ref 0.44–1.00)
GFR, Estimated: >60 mL/min (ref >60)
GFR, Estimated: >60 mL/min (ref >60)
Glucose, Bld: 104 mg/dL — ABNORMAL HIGH (ref 70–99)
Glucose, Bld: 110 mg/dL — ABNORMAL HIGH (ref 70–99)
Potassium: 3.7 mmol/L (ref 3.5–5.1)
Potassium: 3.5 mmol/L (ref 3.5–5.1)
Sodium: 134 mmol/L — ABNORMAL LOW (ref 135–145)
Sodium: 134 mmol/L — ABNORMAL LOW (ref 135–145)
Total Bilirubin: 0.7 mg/dL (ref 0.3–1.2)
Total Bilirubin: 0.6 mg/dL (ref 0.3–1.2)
Total Protein: 7.6 g/dL (ref 6.5–8.1)
Total Protein: 7.4 g/dL (ref 6.5–8.1)

## 2020-08-11 LAB — CBC
HCT: 37.3 % (ref 36.0–46.0)
Hemoglobin: 12.9 g/dL (ref 12.0–15.0)
MCH: 30.1 pg (ref 26.0–34.0)
MCHC: 34.6 g/dL (ref 30.0–36.0)
MCV: 87.1 fL (ref 80.0–100.0)
Platelets: 235 10*3/uL (ref 150–400)
RBC: 4.28 MIL/uL (ref 3.87–5.11)
RDW: 13 % (ref 11.5–15.5)
WBC: 13.9 10*3/uL — ABNORMAL HIGH (ref 4.0–10.5)
nRBC: 0 % (ref 0.0–0.2)

## 2020-08-11 LAB — CBC WITH DIFFERENTIAL/PLATELET
Abs Immature Granulocytes: 0.04 10*3/uL (ref 0.00–0.07)
Basophils Absolute: 0.1 10*3/uL (ref 0.0–0.1)
Basophils Relative: 0 %
Eosinophils Absolute: 0 10*3/uL (ref 0.0–0.5)
Eosinophils Relative: 0 %
HCT: 37.8 % (ref 36.0–46.0)
Hemoglobin: 12.7 g/dL (ref 12.0–15.0)
Immature Granulocytes: 0 %
Lymphocytes Relative: 13 %
Lymphs Abs: 1.7 10*3/uL (ref 0.7–4.0)
MCH: 29.7 pg (ref 26.0–34.0)
MCHC: 33.6 g/dL (ref 30.0–36.0)
MCV: 88.3 fL (ref 80.0–100.0)
Monocytes Absolute: 0.5 10*3/uL (ref 0.1–1.0)
Monocytes Relative: 4 %
Neutro Abs: 10.8 10*3/uL — ABNORMAL HIGH (ref 1.7–7.7)
Neutrophils Relative %: 83 %
Platelets: 220 10*3/uL (ref 150–400)
RBC: 4.28 MIL/uL (ref 3.87–5.11)
RDW: 12.9 % (ref 11.5–15.5)
WBC: 13.1 10*3/uL — ABNORMAL HIGH (ref 4.0–10.5)
nRBC: 0 % (ref 0.0–0.2)

## 2020-08-11 LAB — URINALYSIS, COMPLETE (UACMP) WITH MICROSCOPIC
RBC / HPF: >50 RBC/hpf — ABNORMAL HIGH (ref 0–5)
Specific Gravity, Urine: 1.01 (ref 1.005–1.030)
Squamous Epithelial / HPF: >50 — ABNORMAL HIGH (ref 0–5)
WBC, UA: >50 WBC/hpf — ABNORMAL HIGH (ref 0–5)

## 2020-08-11 LAB — ABO/RH: ABO/RH(D): O POS

## 2020-08-11 LAB — LIPASE, BLOOD
Lipase: 38 U/L (ref 11–51)
Lipase: 37 U/L (ref 11–51)

## 2020-08-11 MED ORDER — OXYCODONE-ACETAMINOPHEN 5-325 MG PO TABS: 1.0000 | ORAL_TABLET | Freq: Four times a day (QID) | ORAL | 0 refills | Status: AC | PRN

## 2020-08-11 MED ORDER — MORPHINE SULFATE (PF) 4 MG/ML IV SOLN
4.0000 mg | Freq: Once | INTRAVENOUS | Status: AC
  Administered 2020-08-11: 4 mg via INTRAVENOUS
  Filled 2020-08-11: qty 1

## 2020-08-11 MED ORDER — HALOPERIDOL LACTATE 5 MG/ML IJ SOLN
5.0000 mg | Freq: Once | INTRAMUSCULAR | Status: AC
  Administered 2020-08-11: 5 mg via INTRAVENOUS
  Filled 2020-08-11: qty 1

## 2020-08-11 MED ORDER — SODIUM CHLORIDE 0.9% FLUSH
3.0000 mL | Freq: Once | INTRAVENOUS | Status: AC
  Administered 2020-08-11: 3 mL via INTRAVENOUS

## 2020-08-11 NOTE — ED Triage Notes (Signed)
G2p0, abd pain , light spotting

## 2020-08-11 NOTE — ED Provider Notes (Signed)
[facility] Emergency Department Provider Note  ____________________________________________   I have reviewed the triage vital signs and the nursing notes.   HISTORY  Chief Complaint Miscarraige  History limited by: Not Limited   HPI Sherry Rose is a 25 y.o. female who presents to the emergency department today because of concern for pain associated with a miscarriage. Patient states she is [redacted] weeks pregnant. Had an ultrasound performed yesterday which did not show any fetal activity. The patient was told to expect that her body would pass the fetus. Today the pain started roughly 3 hours ago. It was severe. She states that she passed out because of the pain. The patient has noticed vaginal spotting.   Records reviewed.   Past Medical History:  Diagnosis Date  . Depression     Patient Active Problem List   Diagnosis Date Noted  . 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 Irregular menses 01/15/2018    History reviewed. No pertinent surgical history.  Prior to Admission medications   Medication Sig Start Date End Date Taking? Authorizing Provider  etonogestrel-ethinyl estradiol (NUVARING) 0.12-0.015 MG/24HR vaginal ring Insert vaginally and leave in place for 3 consecutive weeks, then remove for 1 week. 03/05/19   Nadara Mustard, MD  sertraline (ZOLOFT) 50 MG tablet TAKE 1/2 TABLET ONCE DAILY FOR 3 DAYS THEN 1 TABLET DAILY 12/25/17   [provider]    Allergies Patient has no known allergies.  History reviewed. No pertinent family history.  Social History Social History   Tobacco Use  . Smoking status: Never Smoker  . Smokeless tobacco: Never Used  Vaping Use  . Vaping Use: Never used  Substance Use Topics  . Alcohol use: Never  . Drug use: Never    Review of Systems Constitutional: No fever/chills Eyes: No visual changes. ENT: No sore throat. Cardiovascular: Denies chest pain. Respiratory: Denies shortness of  breath. Gastrointestinal: Positive for abdominal pain. Genitourinary: Positive for vaginal spotting.  Musculoskeletal: Negative for back pain. Skin: Negative for rash. Neurological: Negative for headaches, focal weakness or numbness.  ____________________________________________   PHYSICAL EXAM:  VITAL SIGNS: ED Triage Vitals  Enc Vitals Group     BP 08/11/20 1551 116/63     Pulse Rate 08/11/20 1551 77     Resp 08/11/20 1551 17     Temp 08/11/20 1551 98.5 F (36.9 C)     Temp Source 08/11/20 1551 Oral     SpO2 08/11/20 1551 98 %     Weight --      Height --      Head Circumference --      Peak Flow --      Pain Score 08/11/20 1552 10   Constitutional: Alert and oriented.  Eyes: Conjunctivae are normal.  ENT      Head: Normocephalic and atraumatic.      Nose: No congestion/rhinnorhea.      Mouth/Throat: Mucous membranes are moist.      Neck: No stridor. Hematological/Lymphatic/Immunilogical: No cervical lymphadenopathy. Cardiovascular: Normal rate, regular rhythm.  No murmurs, rubs, or gallops.  Respiratory: Normal respiratory effort without tachypnea nor retractions. Breath sounds are clear and equal bilaterally. No wheezes/rales/rhonchi. Gastrointestinal: Soft and tender to palpation.  Genitourinary: Deferred Musculoskeletal: Normal range of motion in all extremities. No lower extremity edema. Neurologic:  Normal speech and language. No gross focal neurologic deficits are appreciated.  Skin:  Skin is warm, dry and intact. No rash noted. Psychiatric: Mood and affect are normal. Speech and  behavior are normal. Patient exhibits appropriate insight and judgment.  ____________________________________________    LABS (pertinent positives/negatives)  CBC wbc 13.1, hgb 12.7, plt 220 O POS UA turbid, red, RBC and WBC >50, many bacteria, >50 squamous Lipase 37 CMP na 134, k 3.5, glu 110, cr  0.64 ____________________________________________   EKG  None  ____________________________________________    RADIOLOGY  None  ____________________________________________   PROCEDURES  Procedures  ____________________________________________   INITIAL IMPRESSION / ASSESSMENT AND PLAN / ED COURSE  Pertinent labs & imaging results that were available during my care of the patient were reviewed by me and considered in my medical decision making (see chart for details).   Patient presented to the emergency department today because of concerns for abdominal pain and vaginal spotting.  Patient states she was told that she would have a miscarriage based on an ultrasound yesterday which did not show any fetal heart activity.  Patient is O+.  Pain did improve after medications.  At this time I do think miscarriage unlikely.  Discussed with patient expected course.  Discussed return precautions. ____________________________________________   FINAL CLINICAL IMPRESSION(S) / ED DIAGNOSES  Final diagnoses:  Miscarriage     Note: This dictation was prepared with Dragon dictation. Any transcriptional errors that result from this process are unintentional     Phineas Semen, MD 08/11/20 2204

## 2020-08-11 NOTE — Discharge Instructions (Addendum)
Please seek medical attention for any high fevers, chest pain, shortness of breath, change in behavior, persistent vomiting, bloody stool or any other new or concerning symptoms.  

## 2020-09-29 IMAGING — CR DG CHEST 2V
2 series · 2 of 2 positions shown · non-contrast
Comparison: None.

CLINICAL DATA: Cough and congestion for 2 weeks.

EXAM:
CHEST - 2 VIEW

[chest pa]
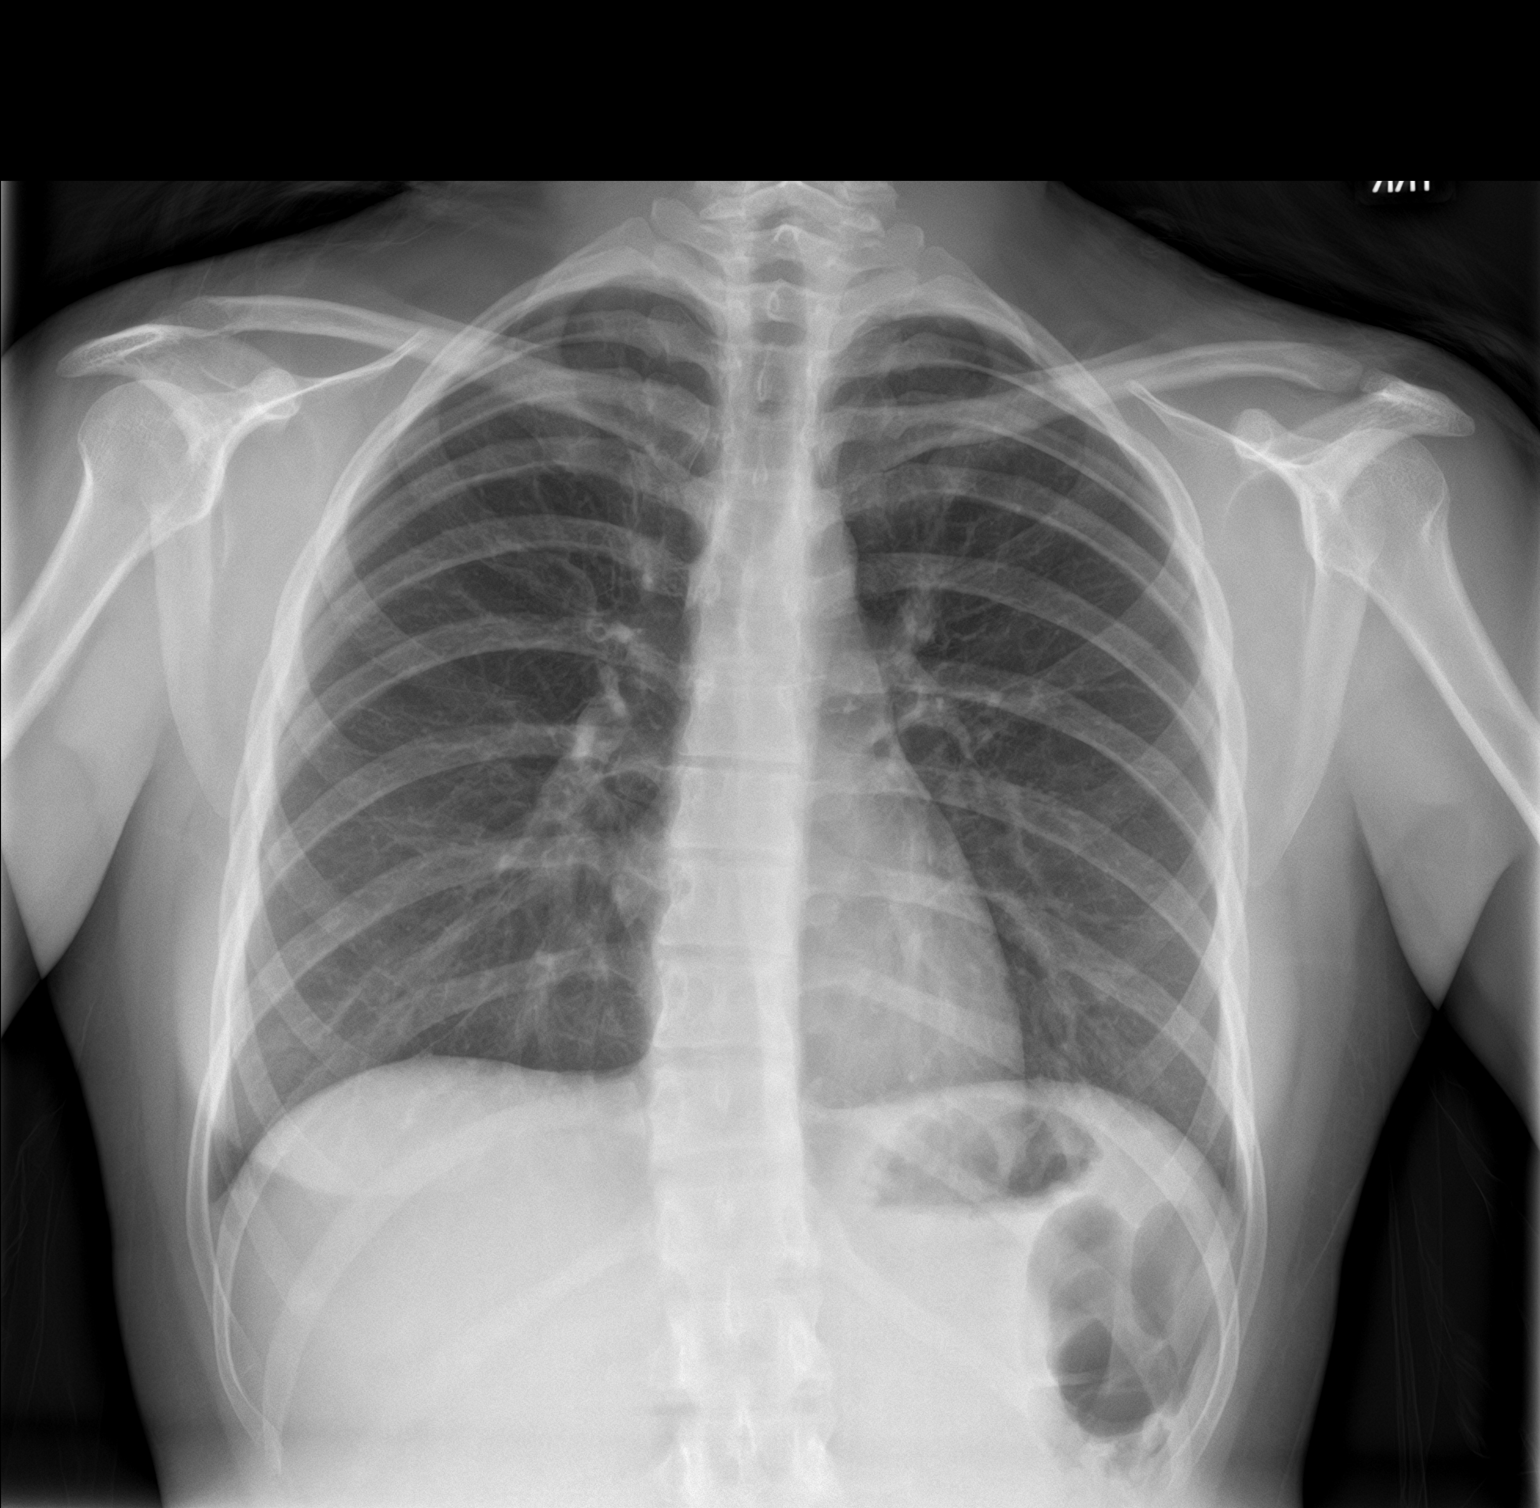

[chest lat]
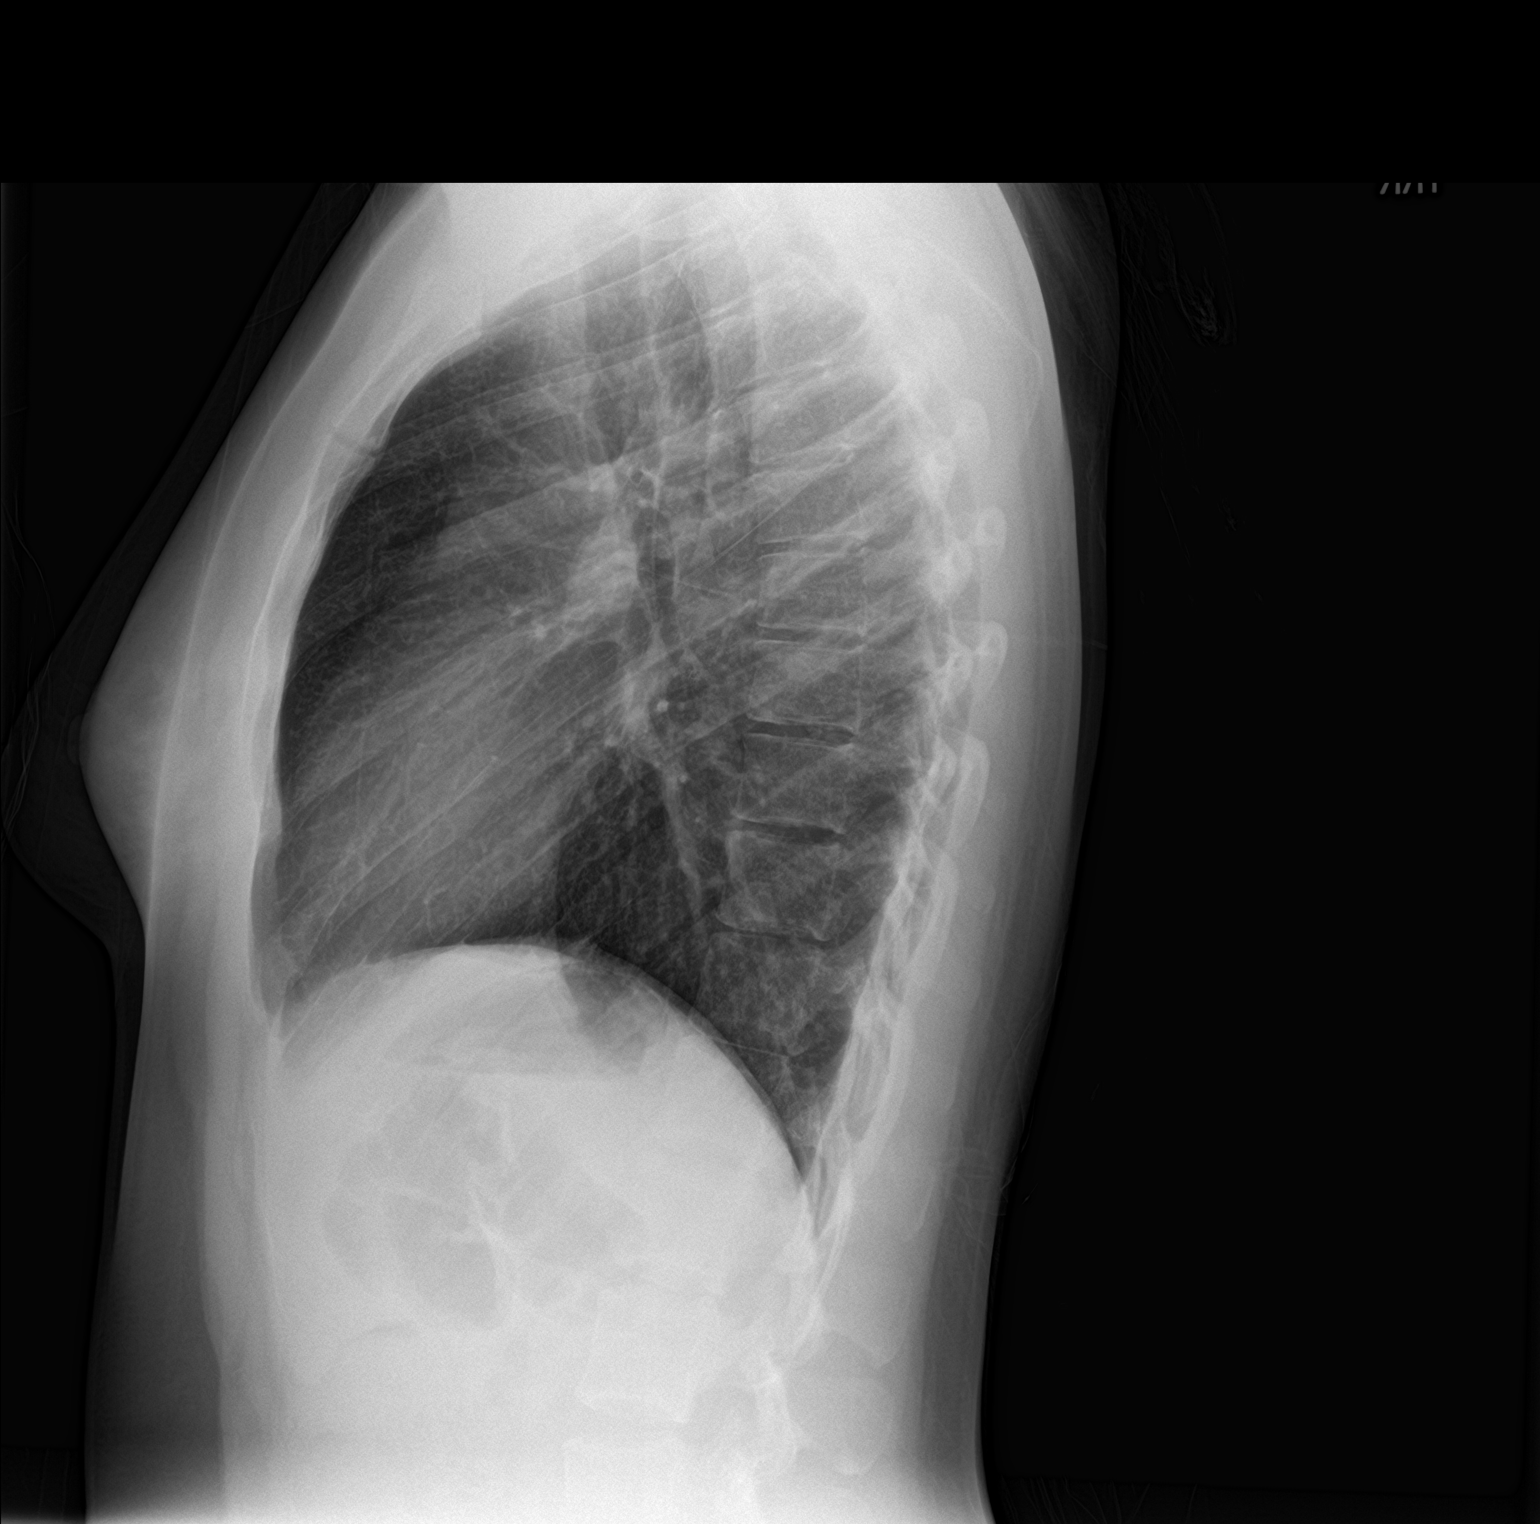

[2 of 2 positions shown; findings below may reference images not displayed]

FINDINGS: The heart size and mediastinal contours are within normal limits.
Both lungs are clear. The visualized skeletal structures are
unremarkable.
IMPRESSION: No active cardiopulmonary disease.

## 2022-04-07 ENCOUNTER — Other Ambulatory Visit (HOSPITAL_COMMUNITY)
Admission: RE | Admit: 2022-04-07 | Discharge: 2022-04-07 | Disposition: A | Discharge status: Home / Self Care | Payer: BC Managed Care – PPO | Source: Ambulatory Visit | Attending: Obstetrics & Gynecology | Admitting: Obstetrics & Gynecology

## 2022-04-07 ENCOUNTER — Ambulatory Visit (INDEPENDENT_AMBULATORY_CARE_PROVIDER_SITE_OTHER): Payer: BC Managed Care – PPO | Admitting: Obstetrics & Gynecology

## 2022-04-07 ENCOUNTER — Encounter: Payer: Self-pay | Admitting: Obstetrics & Gynecology

## 2022-04-07 VITALS — BP 120/80 | Ht 62.5 in | Wt 157.0 lb

## 2022-04-07 DIAGNOSIS — R87612 Low grade squamous intraepithelial lesion on cytologic smear of cervix (LGSIL): Secondary | ICD-10-CM

## 2022-04-07 DIAGNOSIS — Z3202 Encounter for pregnancy test, result negative: Secondary | ICD-10-CM

## 2022-04-07 LAB — POCT URINE PREGNANCY: Preg Test, Ur: NEGATIVE

## 2022-04-07 NOTE — Addendum Note (Signed)
Addended by: Quintella Baton D on: 04/07/2022 02:47 PM   Modules accepted: Orders

## 2022-04-07 NOTE — Progress Notes (Signed)
   Established Patient Office Visit  Subjective   Patient ID: Sherry Rose, female    DOB: 12-16-1995  Age: 27 y.o. MRN: [id number]  Chief Complaint  Patient presents with   Colposcopy    HPI    27 yo P0 here for a colpo due to LGSIL pap at [facility].  Objective:     BP 120/80   Ht 5' 2.5" (1.588 m)   Wt 157 lb (71.2 kg)   LMP 04/01/2022   BMI 28.26 kg/m    Physical Exam   Well nourished, well hydrated Latina, no apparent distress  UPT negative, consent signed, time out done Cervix prepped with acetic acid. Transformation zone seen in its entirety. Colpo adequate. 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 She tolerated the procedure well.  Assessment & Plan:   Problem List Items Addressed This Visit   None Visit Diagnoses     LGSIL on Pap smear of cervix    -  Primary   Relevant Orders   Colposcopy       I will base management plan on ECC results.   Emily Filbert, MD

## 2022-04-08 ENCOUNTER — Encounter: Payer: Self-pay | Admitting: Obstetrics & Gynecology

## 2022-04-12 LAB — SURGICAL PATHOLOGY

## 2022-04-13 ENCOUNTER — Encounter: Payer: Self-pay | Admitting: Obstetrics & Gynecology

## 2023-10-11 ENCOUNTER — Other Ambulatory Visit: Payer: Self-pay

## 2023-10-11 DIAGNOSIS — N926 Irregular menstruation, unspecified: Secondary | ICD-10-CM

## 2023-10-17 ENCOUNTER — Ambulatory Visit
Admission: RE | Admit: 2023-10-17 | Discharge: 2023-10-17 | Disposition: A | Discharge status: Home / Self Care | Source: Ambulatory Visit

## 2023-10-17 DIAGNOSIS — N926 Irregular menstruation, unspecified: Secondary | ICD-10-CM | POA: Insufficient documentation
